# Patient Record
Sex: Male | Born: 1970 | Race: Black or African American | Hispanic: No | Marital: Single | Smoking: Current every day smoker
Health system: Southern US, Community
[De-identification: ages and names within clinical notes are randomized; demographics above are authoritative.]

## PROBLEM LIST (undated history)

## (undated) DIAGNOSIS — I10 Essential (primary) hypertension: Secondary | ICD-10-CM

## (undated) HISTORY — PX: CHOLECYSTECTOMY: SHX55

---

## 2003-07-08 ENCOUNTER — Encounter: Payer: Self-pay | Admitting: Emergency Medicine

## 2003-07-08 ENCOUNTER — Emergency Department (HOSPITAL_COMMUNITY): Admission: EM | Admit: 2003-07-08 | Discharge: 2003-07-08 | Payer: Self-pay | Admitting: Emergency Medicine

## 2004-01-12 ENCOUNTER — Emergency Department (HOSPITAL_COMMUNITY): Admission: EM | Admit: 2004-01-12 | Discharge: 2004-01-12 | Payer: Self-pay | Admitting: Emergency Medicine

## 2004-03-29 ENCOUNTER — Emergency Department (HOSPITAL_COMMUNITY): Admission: EM | Admit: 2004-03-29 | Discharge: 2004-03-29 | Payer: Self-pay | Admitting: Emergency Medicine

## 2004-10-31 ENCOUNTER — Emergency Department (HOSPITAL_COMMUNITY): Admission: EM | Admit: 2004-10-31 | Discharge: 2004-10-31 | Payer: Self-pay | Admitting: Emergency Medicine

## 2015-03-19 ENCOUNTER — Emergency Department (HOSPITAL_COMMUNITY)
Admission: EM | Admit: 2015-03-19 | Discharge: 2015-03-19 | Disposition: A | Discharge status: Home / Self Care | Payer: No Typology Code available for payment source | Attending: Emergency Medicine | Admitting: Emergency Medicine

## 2015-03-19 ENCOUNTER — Encounter (HOSPITAL_COMMUNITY): Payer: Self-pay | Admitting: *Deleted

## 2015-03-19 ENCOUNTER — Emergency Department (HOSPITAL_COMMUNITY): Payer: No Typology Code available for payment source

## 2015-03-19 DIAGNOSIS — Y999 Unspecified external cause status: Secondary | ICD-10-CM | POA: Insufficient documentation

## 2015-03-19 DIAGNOSIS — S4991XA Unspecified injury of right shoulder and upper arm, initial encounter: Secondary | ICD-10-CM | POA: Diagnosis not present

## 2015-03-19 DIAGNOSIS — Y939 Activity, unspecified: Secondary | ICD-10-CM | POA: Diagnosis not present

## 2015-03-19 DIAGNOSIS — Y92410 Unspecified street and highway as the place of occurrence of the external cause: Secondary | ICD-10-CM | POA: Diagnosis not present

## 2015-03-19 DIAGNOSIS — Z720 Tobacco use: Secondary | ICD-10-CM | POA: Insufficient documentation

## 2015-03-19 DIAGNOSIS — S79911A Unspecified injury of right hip, initial encounter: Secondary | ICD-10-CM | POA: Insufficient documentation

## 2015-03-19 DIAGNOSIS — T148XXA Other injury of unspecified body region, initial encounter: Secondary | ICD-10-CM

## 2015-03-19 NOTE — Discharge Instructions (Signed)
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
## 2016-01-21 ENCOUNTER — Encounter (HOSPITAL_COMMUNITY): Admission: EM | Payer: Self-pay | Source: Home / Self Care

## 2016-01-21 ENCOUNTER — Inpatient Hospital Stay (HOSPITAL_COMMUNITY): Payer: Medicaid Other | Admitting: Certified Registered Nurse Anesthetist

## 2016-01-21 ENCOUNTER — Inpatient Hospital Stay (HOSPITAL_COMMUNITY)
Admission: EM | Admit: 2016-01-21 | Discharge: 2016-01-23 | DRG: 340 | Payer: Medicaid Other | Attending: General Surgery | Admitting: General Surgery

## 2016-01-21 ENCOUNTER — Emergency Department (HOSPITAL_COMMUNITY): Payer: Medicaid Other

## 2016-01-21 ENCOUNTER — Encounter (HOSPITAL_COMMUNITY): Payer: Self-pay | Admitting: *Deleted

## 2016-01-21 DIAGNOSIS — R300 Dysuria: Secondary | ICD-10-CM | POA: Diagnosis present

## 2016-01-21 DIAGNOSIS — Z653 Problems related to other legal circumstances: Secondary | ICD-10-CM | POA: Diagnosis not present

## 2016-01-21 DIAGNOSIS — I10 Essential (primary) hypertension: Secondary | ICD-10-CM | POA: Diagnosis present

## 2016-01-21 DIAGNOSIS — F17210 Nicotine dependence, cigarettes, uncomplicated: Secondary | ICD-10-CM | POA: Diagnosis present

## 2016-01-21 DIAGNOSIS — K353 Acute appendicitis with localized peritonitis, without perforation or gangrene: Secondary | ICD-10-CM

## 2016-01-21 DIAGNOSIS — K37 Unspecified appendicitis: Secondary | ICD-10-CM | POA: Diagnosis present

## 2016-01-21 HISTORY — DX: Essential (primary) hypertension: I10

## 2016-01-21 HISTORY — PX: LAPAROSCOPIC APPENDECTOMY: SHX408

## 2016-01-21 LAB — COMPREHENSIVE METABOLIC PANEL
ALBUMIN: 3.7 g/dL (ref 3.5–5.0)
ALK PHOS: 49 U/L (ref 38–126)
ALT: 20 U/L (ref 17–63)
AST: 18 U/L (ref 15–41)
Anion gap: 14 (ref 5–15)
BILIRUBIN TOTAL: 0.9 mg/dL (ref 0.3–1.2)
BUN: 13 mg/dL (ref 6–20)
CHLORIDE: 102 mmol/L (ref 101–111)
CO2: 23 mmol/L (ref 22–32)
CREATININE: 1.09 mg/dL (ref 0.61–1.24)
Calcium: 9.2 mg/dL (ref 8.9–10.3)
GFR calc non Af Amer: >60 mL/min (ref >60)
Glucose, Bld: 101 mg/dL — ABNORMAL HIGH (ref 65–99)
Potassium: 4.4 mmol/L (ref 3.5–5.1)
Sodium: 139 mmol/L (ref 135–145)
Total Protein: 7.1 g/dL (ref 6.5–8.1)

## 2016-01-21 LAB — CBC
HCT: 42.5 % (ref 39.0–52.0)
Hemoglobin: 15.4 g/dL (ref 13.0–17.0)
MCH: 30.7 pg (ref 26.0–34.0)
MCHC: 36.2 g/dL — AB (ref 30.0–36.0)
MCV: 84.7 fL (ref 78.0–100.0)
PLATELETS: 210 10*3/uL (ref 150–400)
RBC: 5.02 MIL/uL (ref 4.22–5.81)
RDW: 12.2 % (ref 11.5–15.5)
WBC: 14.1 10*3/uL — ABNORMAL HIGH (ref 4.0–10.5)

## 2016-01-21 LAB — LIPASE, BLOOD: Lipase: 19 U/L (ref 11–51)

## 2016-01-21 SURGERY — APPENDECTOMY, LAPAROSCOPIC
Anesthesia: General

## 2016-01-21 MED ORDER — METRONIDAZOLE IN NACL 5-0.79 MG/ML-% IV SOLN
500.0000 mg | Freq: Three times a day (TID) | INTRAVENOUS | Status: DC
  Administered 2016-01-21 – 2016-01-22 (×3): 500 mg via INTRAVENOUS
  Filled 2016-01-21 (×4): qty 100

## 2016-01-21 MED ORDER — ACETAMINOPHEN 650 MG RE SUPP: 650.0000 mg | Freq: Four times a day (QID) | RECTAL | Status: DC | PRN

## 2016-01-21 MED ORDER — PANTOPRAZOLE SODIUM 40 MG IV SOLR
40.0000 mg | Freq: Every day | INTRAVENOUS | Status: DC
  Administered 2016-01-21: 40 mg via INTRAVENOUS
  Filled 2016-01-21: qty 40

## 2016-01-21 MED ORDER — IOHEXOL 300 MG/ML  SOLN
25.0000 mL | Freq: Once | INTRAMUSCULAR | Status: AC | PRN
  Administered 2016-01-21: 25 mL via ORAL

## 2016-01-21 MED ORDER — FENTANYL CITRATE (PF) 100 MCG/2ML IJ SOLN
INTRAMUSCULAR | Status: DC | PRN
  Administered 2016-01-21: 100 ug via INTRAVENOUS

## 2016-01-21 MED ORDER — ONDANSETRON HCL 4 MG/2ML IJ SOLN
4.0000 mg | Freq: Four times a day (QID) | INTRAMUSCULAR | Status: DC | PRN
  Administered 2016-01-21: 4 mg via INTRAVENOUS
  Filled 2016-01-21: qty 2

## 2016-01-21 MED ORDER — ROCURONIUM BROMIDE 100 MG/10ML IV SOLN
INTRAVENOUS | Status: DC | PRN
  Administered 2016-01-21: 25 mg via INTRAVENOUS

## 2016-01-21 MED ORDER — DEXAMETHASONE SODIUM PHOSPHATE 10 MG/ML IJ SOLN
INTRAMUSCULAR | Status: DC | PRN
  Administered 2016-01-21: 10 mg via INTRAVENOUS

## 2016-01-21 MED ORDER — PROPOFOL 10 MG/ML IV BOLUS
INTRAVENOUS | Status: AC
  Filled 2016-01-21: qty 20

## 2016-01-21 MED ORDER — ENOXAPARIN SODIUM 40 MG/0.4ML ~~LOC~~ SOLN
40.0000 mg | SUBCUTANEOUS | Status: DC
  Administered 2016-01-21 – 2016-01-22 (×2): 40 mg via SUBCUTANEOUS
  Filled 2016-01-21 (×2): qty 0.4

## 2016-01-21 MED ORDER — BUPIVACAINE HCL (PF) 0.25 % IJ SOLN
INTRAMUSCULAR | Status: AC
  Filled 2016-01-21: qty 30

## 2016-01-21 MED ORDER — LIDOCAINE HCL (CARDIAC) 20 MG/ML IV SOLN
INTRAVENOUS | Status: AC
  Filled 2016-01-21: qty 5

## 2016-01-21 MED ORDER — BUPIVACAINE-EPINEPHRINE 0.25% -1:200000 IJ SOLN
INTRAMUSCULAR | Status: DC | PRN
  Administered 2016-01-21: 8 mL

## 2016-01-21 MED ORDER — EPHEDRINE SULFATE 50 MG/ML IJ SOLN
INTRAMUSCULAR | Status: AC
  Filled 2016-01-21: qty 1

## 2016-01-21 MED ORDER — HYDROMORPHONE HCL 1 MG/ML IJ SOLN: 0.2500 mg | INTRAMUSCULAR | Status: DC | PRN

## 2016-01-21 MED ORDER — SODIUM CHLORIDE 0.9 % IV BOLUS (SEPSIS)
1000.0000 mL | Freq: Once | INTRAVENOUS | Status: AC
  Administered 2016-01-21: 1000 mL via INTRAVENOUS

## 2016-01-21 MED ORDER — PROPOFOL 10 MG/ML IV BOLUS
INTRAVENOUS | Status: DC | PRN
  Administered 2016-01-21: 200 mg via INTRAVENOUS

## 2016-01-21 MED ORDER — CIPROFLOXACIN IN D5W 400 MG/200ML IV SOLN
400.0000 mg | Freq: Two times a day (BID) | INTRAVENOUS | Status: DC
  Administered 2016-01-21 – 2016-01-22 (×2): 400 mg via INTRAVENOUS
  Filled 2016-01-21 (×3): qty 200

## 2016-01-21 MED ORDER — MORPHINE SULFATE (PF) 2 MG/ML IV SOLN
2.0000 mg | INTRAVENOUS | Status: DC | PRN
  Administered 2016-01-21: 2 mg via INTRAVENOUS
  Filled 2016-01-21: qty 1

## 2016-01-21 MED ORDER — DEXAMETHASONE SODIUM PHOSPHATE 10 MG/ML IJ SOLN
INTRAMUSCULAR | Status: AC
  Filled 2016-01-21: qty 1

## 2016-01-21 MED ORDER — SUCCINYLCHOLINE CHLORIDE 20 MG/ML IJ SOLN
INTRAMUSCULAR | Status: DC | PRN
  Administered 2016-01-21: 100 mg via INTRAVENOUS

## 2016-01-21 MED ORDER — FENTANYL CITRATE (PF) 250 MCG/5ML IJ SOLN
INTRAMUSCULAR | Status: AC
  Filled 2016-01-21: qty 5

## 2016-01-21 MED ORDER — OXYCODONE HCL 5 MG PO TABS
5.0000 mg | ORAL_TABLET | ORAL | Status: DC | PRN
  Administered 2016-01-22: 10 mg via ORAL
  Administered 2016-01-22 (×2): 5 mg via ORAL
  Administered 2016-01-23: 10 mg via ORAL
  Filled 2016-01-21: qty 1
  Filled 2016-01-21: qty 2
  Filled 2016-01-21: qty 1
  Filled 2016-01-21: qty 2

## 2016-01-21 MED ORDER — LIDOCAINE HCL (CARDIAC) 20 MG/ML IV SOLN
INTRAVENOUS | Status: DC | PRN
  Administered 2016-01-21: 50 mg via INTRAVENOUS

## 2016-01-21 MED ORDER — SODIUM CHLORIDE 0.9 % IV SOLN
INTRAVENOUS | Status: DC
  Administered 2016-01-21 – 2016-01-22 (×2): via INTRAVENOUS

## 2016-01-21 MED ORDER — IOHEXOL 300 MG/ML  SOLN
100.0000 mL | Freq: Once | INTRAMUSCULAR | Status: AC | PRN
  Administered 2016-01-21: 100 mL via INTRAVENOUS

## 2016-01-21 MED ORDER — ONDANSETRON HCL 4 MG/2ML IJ SOLN
INTRAMUSCULAR | Status: AC
  Filled 2016-01-21: qty 2

## 2016-01-21 MED ORDER — ONDANSETRON 4 MG PO TBDP: 4.0000 mg | ORAL_TABLET | Freq: Four times a day (QID) | ORAL | Status: DC | PRN

## 2016-01-21 MED ORDER — LACTATED RINGERS IV SOLN
INTRAVENOUS | Status: DC | PRN
  Administered 2016-01-21: 21:00:00 via INTRAVENOUS

## 2016-01-21 MED ORDER — SUGAMMADEX SODIUM 200 MG/2ML IV SOLN
INTRAVENOUS | Status: AC
  Filled 2016-01-21: qty 2

## 2016-01-21 MED ORDER — KETOROLAC TROMETHAMINE 15 MG/ML IJ SOLN
15.0000 mg | Freq: Four times a day (QID) | INTRAMUSCULAR | Status: DC | PRN
  Administered 2016-01-22: 15 mg via INTRAVENOUS
  Filled 2016-01-21: qty 1

## 2016-01-21 MED ORDER — ACETAMINOPHEN 325 MG PO TABS: 650.0000 mg | ORAL_TABLET | Freq: Four times a day (QID) | ORAL | Status: DC | PRN

## 2016-01-21 MED ORDER — SIMETHICONE 80 MG PO CHEW
40.0000 mg | CHEWABLE_TABLET | Freq: Four times a day (QID) | ORAL | Status: DC | PRN
  Administered 2016-01-22: 40 mg via ORAL
  Filled 2016-01-21: qty 1

## 2016-01-21 MED ORDER — ONDANSETRON HCL 4 MG/2ML IJ SOLN
INTRAMUSCULAR | Status: DC | PRN
  Administered 2016-01-21: 4 mg via INTRAVENOUS

## 2016-01-21 MED ORDER — MIDAZOLAM HCL 5 MG/5ML IJ SOLN
INTRAMUSCULAR | Status: DC | PRN
  Administered 2016-01-21: 2 mg via INTRAVENOUS

## 2016-01-21 MED ORDER — SODIUM CHLORIDE 0.9 % IR SOLN
Status: DC | PRN
  Administered 2016-01-21: 1000 mL

## 2016-01-21 MED ORDER — METHOCARBAMOL 500 MG PO TABS
500.0000 mg | ORAL_TABLET | Freq: Four times a day (QID) | ORAL | Status: DC | PRN
  Administered 2016-01-22 (×3): 500 mg via ORAL
  Filled 2016-01-21 (×3): qty 1

## 2016-01-21 MED ORDER — PHENYLEPHRINE 40 MCG/ML (10ML) SYRINGE FOR IV PUSH (FOR BLOOD PRESSURE SUPPORT)
PREFILLED_SYRINGE | INTRAVENOUS | Status: AC
  Filled 2016-01-21: qty 10

## 2016-01-21 MED ORDER — SUGAMMADEX SODIUM 200 MG/2ML IV SOLN
INTRAVENOUS | Status: DC | PRN
  Administered 2016-01-21: 200 mg via INTRAVENOUS

## 2016-01-21 MED ORDER — SUCCINYLCHOLINE CHLORIDE 20 MG/ML IJ SOLN
INTRAMUSCULAR | Status: AC
  Filled 2016-01-21: qty 1

## 2016-01-21 MED ORDER — ROCURONIUM BROMIDE 50 MG/5ML IV SOLN
INTRAVENOUS | Status: AC
  Filled 2016-01-21: qty 1

## 2016-01-21 MED ORDER — MIDAZOLAM HCL 2 MG/2ML IJ SOLN
INTRAMUSCULAR | Status: AC
Start: 2016-01-21 — End: 2016-01-21
  Filled 2016-01-21: qty 2

## 2016-01-21 MED ORDER — ENOXAPARIN SODIUM 40 MG/0.4ML ~~LOC~~ SOLN: 40.0000 mg | SUBCUTANEOUS | Status: DC

## 2016-01-21 SURGICAL SUPPLY — 50 items
ADH SKN CLS LQ APL DERMABOND (GAUZE/BANDAGES/DRESSINGS) ×1
APPLIER CLIP 5 13 M/L LIGAMAX5 (MISCELLANEOUS) ×3
APPLIER CLIP ROT 10 11.4 M/L (STAPLE)
APR CLP MED LRG 11.4X10 (STAPLE)
APR CLP MED LRG 5 ANG JAW (MISCELLANEOUS) ×1
BAG SPEC RTRVL 10 TROC 200 (ENDOMECHANICALS) ×1
CANISTER SUCTION 2500CC (MISCELLANEOUS) ×3 IMPLANT
CHLORAPREP W/TINT 26ML (MISCELLANEOUS) ×3 IMPLANT
CLIP APPLIE 5 13 M/L LIGAMAX5 (MISCELLANEOUS) IMPLANT
CLIP APPLIE ROT 10 11.4 M/L (STAPLE) IMPLANT
CLOSURE STERI-STRIP 1/4X4 (GAUZE/BANDAGES/DRESSINGS) ×2 IMPLANT
CLOSURE WOUND 1/2 X4 (GAUZE/BANDAGES/DRESSINGS) ×1
COVER SURGICAL LIGHT HANDLE (MISCELLANEOUS) ×3 IMPLANT
CUTTER FLEX LINEAR 45M (STAPLE) ×3 IMPLANT
DERMABOND ADHESIVE PROPEN (GAUZE/BANDAGES/DRESSINGS) ×2
DERMABOND ADVANCED .7 DNX6 (GAUZE/BANDAGES/DRESSINGS) IMPLANT
DEVICE TROCAR PUNCTURE CLOSURE (ENDOMECHANICALS) ×3 IMPLANT
ELECT REM PT RETURN 9FT ADLT (ELECTROSURGICAL) ×3
ELECTRODE REM PT RTRN 9FT ADLT (ELECTROSURGICAL) ×1 IMPLANT
GLOVE BIO SURGEON STRL SZ7 (GLOVE) ×3 IMPLANT
GLOVE BIOGEL PI IND STRL 7.5 (GLOVE) ×1 IMPLANT
GLOVE BIOGEL PI INDICATOR 7.5 (GLOVE) ×2
GOWN STRL REUS W/ TWL LRG LVL3 (GOWN DISPOSABLE) ×3 IMPLANT
GOWN STRL REUS W/TWL LRG LVL3 (GOWN DISPOSABLE) ×9
KIT BASIN OR (CUSTOM PROCEDURE TRAY) ×3 IMPLANT
KIT ROOM TURNOVER OR (KITS) ×3 IMPLANT
LIQUID BAND (GAUZE/BANDAGES/DRESSINGS) ×3 IMPLANT
NS IRRIG 1000ML POUR BTL (IV SOLUTION) ×3 IMPLANT
PAD ARMBOARD 7.5X6 YLW CONV (MISCELLANEOUS) ×6 IMPLANT
POUCH RETRIEVAL ECOSAC 10 (ENDOMECHANICALS) ×1 IMPLANT
POUCH RETRIEVAL ECOSAC 10MM (ENDOMECHANICALS) ×2
RELOAD 45 VASCULAR/THIN (ENDOMECHANICALS) ×3 IMPLANT
RELOAD STAPLE 45 2.5 WHT GRN (ENDOMECHANICALS) ×1 IMPLANT
RELOAD STAPLE 45 3.5 BLU ETS (ENDOMECHANICALS) IMPLANT
RELOAD STAPLE TA45 3.5 REG BLU (ENDOMECHANICALS) IMPLANT
SCALPEL HARMONIC ACE (MISCELLANEOUS) ×3 IMPLANT
SCISSORS LAP 5X35 DISP (ENDOMECHANICALS) IMPLANT
SET IRRIG TUBING LAPAROSCOPIC (IRRIGATION / IRRIGATOR) ×3 IMPLANT
SLEEVE ENDOPATH XCEL 5M (ENDOMECHANICALS) ×3 IMPLANT
SPECIMEN JAR SMALL (MISCELLANEOUS) ×3 IMPLANT
STRIP CLOSURE SKIN 1/2X4 (GAUZE/BANDAGES/DRESSINGS) ×2 IMPLANT
SUT MNCRL AB 4-0 PS2 18 (SUTURE) ×3 IMPLANT
SUT VICRYL 0 UR6 27IN ABS (SUTURE) ×3 IMPLANT
TOWEL OR 17X24 6PK STRL BLUE (TOWEL DISPOSABLE) ×3 IMPLANT
TOWEL OR 17X26 10 PK STRL BLUE (TOWEL DISPOSABLE) ×3 IMPLANT
TRAY FOLEY CATH 16FR SILVER (SET/KITS/TRAYS/PACK) ×3 IMPLANT
TRAY LAPAROSCOPIC MC (CUSTOM PROCEDURE TRAY) ×3 IMPLANT
TROCAR XCEL BLUNT TIP 100MML (ENDOMECHANICALS) ×3 IMPLANT
TROCAR XCEL NON-BLD 5MMX100MML (ENDOMECHANICALS) ×3 IMPLANT
TUBING INSUFFLATION (TUBING) ×3 IMPLANT

## 2016-01-21 NOTE — ED Notes (Signed)
Patient handed urinal to use to attempt to provide an urinal specimen; patient still in custody of [facility]
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
## 2016-01-21 NOTE — H&P (Signed)
Glenn Hanson is an 45 y.o. male.   Chief Complaint: abdominal pain HPI: 9 yom who is incarcerated presents with several days of abdominal pain primarily suprapubic now.  Has some n/v, no fevers.  Some dysuria.  Less oral intake, had bm today.     Past Medical History  Diagnosis Date  . Hypertension     Past Surgical History  Procedure Laterality Date  . Cholecystectomy      No family history on file. Social History:  reports that he has been smoking Cigarettes.  He has been smoking about 2.00 packs per day. He does not have any smokeless tobacco history on file. He reports that he drinks about 1.2 oz of alcohol per week. He reports that he does not use illicit drugs.  Allergies:  Allergies  Allergen Reactions  . Flexeril [Cyclobenzaprine]     itching  . Keflex [Cephalexin]     Hives     meds none  Results for orders placed or performed during the hospital encounter of 01/21/16 (from the past 48 hour(s))  Lipase, blood     Status: None   Collection Time: 01/21/16  3:46 PM  Result Value Ref Range   Lipase 19 11 - 51 U/L  Comprehensive metabolic panel     Status: Abnormal   Collection Time: 01/21/16  3:46 PM  Result Value Ref Range   Sodium 139 135 - 145 mmol/L   Potassium 4.4 3.5 - 5.1 mmol/L   Chloride 102 101 - 111 mmol/L   CO2 23 22 - 32 mmol/L   Glucose, Bld 101 (H) 65 - 99 mg/dL   BUN 13 6 - 20 mg/dL   Creatinine, Ser 1.09 0.61 - 1.24 mg/dL   Calcium 9.2 8.9 - 10.3 mg/dL   Total Protein 7.1 6.5 - 8.1 g/dL   Albumin 3.7 3.5 - 5.0 g/dL   AST 18 15 - 41 U/L   ALT 20 17 - 63 U/L   Alkaline Phosphatase 49 38 - 126 U/L   Total Bilirubin 0.9 0.3 - 1.2 mg/dL   GFR calc non Af Amer >60 >60 mL/min   GFR calc Af Amer >60 >60 mL/min    Comment: (NOTE) The eGFR has been calculated using the CKD EPI equation. This calculation has not been validated in all clinical situations. eGFR's persistently <60 mL/min signify possible Chronic Kidney Disease.    Anion gap 14  5 - 15  CBC     Status: Abnormal   Collection Time: 01/21/16  3:46 PM  Result Value Ref Range   WBC 14.1 (H) 4.0 - 10.5 K/uL   RBC 5.02 4.22 - 5.81 MIL/uL   Hemoglobin 15.4 13.0 - 17.0 g/dL   HCT 42.5 39.0 - 52.0 %   MCV 84.7 78.0 - 100.0 fL   MCH 30.7 26.0 - 34.0 pg   MCHC 36.2 (H) 30.0 - 36.0 g/dL   RDW 12.2 11.5 - 15.5 %   Platelets 210 150 - 400 K/uL   Ct Abdomen Pelvis W Contrast  01/21/2016  CLINICAL DATA:  Abdominal pain and distention with nausea and vomiting for several days. Cholecystectomy. EXAM: CT ABDOMEN AND PELVIS WITH CONTRAST TECHNIQUE: Multidetector CT imaging of the abdomen and pelvis was performed using the standard protocol following bolus administration of intravenous contrast. CONTRAST:  182m OMNIPAQUE IOHEXOL 300 MG/ML  SOLN COMPARISON:  None. FINDINGS: Lower chest: Anterior right lower lobe 4 mm solid pulmonary nodule (series 205/image 3). Hepatobiliary: Hypodense 0.4 cm lesion in segment 8 at  the right liver dome, too small to characterize. Otherwise normal liver. Cholecystectomy. No biliary ductal dilatation. Pancreas: Normal, with no mass or duct dilation. Spleen: Normal size. No mass. Adrenals/Urinary Tract: Normal adrenals. Normal kidneys with no hydronephrosis and no renal mass. Normal bladder. Stomach/Bowel: Grossly normal stomach. Normal caliber small bowel. Oral contrast progresses to the rectum. There is continuous circumferential bowel wall thickening in the distal and terminal ileum. There is diffuse dilatation of the appendix (13 mm appendiceal diameter). There is diffuse wall thickening and wall hyperenhancement throughout the appendix, with associated periappendiceal fat stranding and ill-defined fluid in the right lower quadrant, in keeping with acute appendicitis. Normal large bowel with no diverticulosis, large bowel wall thickening or pericolonic fat stranding. Vascular/Lymphatic: Normal caliber abdominal aorta. Patent portal, splenic, hepatic and renal  veins. No pathologically enlarged lymph nodes in the abdomen or pelvis. Reproductive: Top-normal size prostate. Other: No pneumoperitoneum, ascites or focal fluid collection. Musculoskeletal: No aggressive appearing focal osseous lesions. Mild degenerative changes in the visualized thoracolumbar spine. IMPRESSION: 1. Acute appendicitis.  No perforation or abscess. 2. Wall thickening in the distal and terminal ileum, probably reactive. 3. Right lower lobe 4 mm pulmonary nodule. If the patient is at high risk for bronchogenic carcinoma, follow-up chest CT at 1 year is recommended. If the patient is at low risk, no follow-up is needed. This recommendation follows the consensus statement: Guidelines for Management of Small Pulmonary Nodules Detected on CT Scans: A Statement from the [place] as published in Radiology 2005; 237:395-400. These results were called by telephone at the time of interpretation on 01/21/2016 at 5:50 pm to [name] , who verbally acknowledged these results. Electronically Signed   By: Ilona Sorrel M.D.   On: 01/21/2016 17:52    Review of Systems  Constitutional: Negative for fever and chills.  Respiratory: Negative for shortness of breath.   Cardiovascular: Negative for chest pain.  Gastrointestinal: Positive for nausea, vomiting and abdominal pain. Negative for diarrhea, constipation and blood in stool.  Genitourinary: Positive for dysuria.    Blood pressure 124/76, pulse 76, temperature 98.5 F (36.9 C), temperature source Oral, resp. rate 16, height '5\' 6"'$  (1.676 m), weight 69.854 kg (154 lb), SpO2 100 %. Physical Exam  Vitals reviewed. Constitutional: He is oriented to person, place, and time. He appears well-developed and well-nourished.  HENT:  Head: Normocephalic and atraumatic.  Eyes: No scleral icterus.  Cardiovascular: Normal rate, regular rhythm and normal heart sounds.   Respiratory: Effort normal and breath sounds normal. He has no wheezes. He has no  rales.  GI: Soft. Bowel sounds are normal. He exhibits no distension. There is tenderness in the right lower quadrant, suprapubic area and left lower quadrant. No hernia.  Lymphadenopathy:    He has no cervical adenopathy.  Neurological: He is alert and oriented to person, place, and time.     Assessment/Plan Appendicitis  Discussed lap appy, abx.  Discussed possible perforation vs acute appy and recovery/time in hospital.  Risks discussed.  Will proceed tonight  Bacilio Abascal 01/21/2016, 7:20 PM

## 2016-01-21 NOTE — Anesthesia Procedure Notes (Signed)
Procedure Name: Intubation Date/Time: 01/21/2016 8:37 PM Performed by: Little Ishikawa L Pre-anesthesia Checklist: Patient identified, Timeout performed, Emergency Drugs available, Suction available and Patient being monitored Patient Re-evaluated:Patient Re-evaluated prior to inductionOxygen Delivery Method: Circle system utilized Preoxygenation: Pre-oxygenation with 100% oxygen Intubation Type: IV induction, Rapid sequence and Cricoid Pressure applied Laryngoscope Size: Mac and 4 (no attempt to mask ventilate. RSI) Grade View: Grade I Tube type: Oral Tube size: 7.5 mm Number of attempts: 1 Airway Equipment and Method: Stylet Placement Confirmation: ETT inserted through vocal cords under direct vision,  positive ETCO2 and breath sounds checked- equal and bilateral Secured at: 22 cm Tube secured with: Tape Dental Injury: Teeth and Oropharynx as per pre-operative assessment

## 2016-01-21 NOTE — Anesthesia Preprocedure Evaluation (Addendum)
Anesthesia Evaluation  Patient identified by MRN, date of birth, ID band Patient awake    Reviewed: Allergy & Precautions, H&P , NPO status , Patient's Chart, lab work & pertinent test results  Airway Mallampati: II  TM Distance: >3 FB Neck ROM: Full    Dental no notable dental hx. (+) Teeth Intact, Dental Advisory Given   Pulmonary    Pulmonary exam normal breath sounds clear to auscultation       Cardiovascular hypertension,  Rhythm:Regular Rate:Normal     Neuro/Psych negative neurological ROS  negative psych ROS   GI/Hepatic negative GI ROS, Neg liver ROS,   Endo/Other  negative endocrine ROS  Renal/GU negative Renal ROS  negative genitourinary   Musculoskeletal   Abdominal   Peds  Hematology negative hematology ROS (+)   Anesthesia Other Findings   Reproductive/Obstetrics negative OB ROS                           Anesthesia Physical Anesthesia Plan  ASA: II and emergent  Anesthesia Plan: General   Post-op Pain Management:    Induction: Intravenous, Rapid sequence and Cricoid pressure planned  Airway Management Planned: Oral ETT  Additional Equipment:   Intra-op Plan:   Post-operative Plan: Extubation in OR  Informed Consent: I have reviewed the patients History and Physical, chart, labs and discussed the procedure including the risks, benefits and alternatives for the proposed anesthesia with the patient or authorized representative who has indicated his/her understanding and acceptance.   Dental advisory given  Plan Discussed with: CRNA  Anesthesia Plan Comments:         Anesthesia Quick Evaluation

## 2016-01-21 NOTE — OR Nursing (Signed)
Pt brought into OR with two Pharmacist, hospital per Forensic Patient policy.

## 2016-01-21 NOTE — ED Provider Notes (Signed)
Pt seen and examined.  D/W PA-C.  Reports AP for 5-6 days, no w localized to RLQ-Suprapubic abdomen.  + Leukocytosis, and CT with +Appendicitis, -Perforation.  Discussed with patient and Dr. Dwain Sarna of [facility].  Rolland Porter, MD 01/21/16 Rickey Primus

## 2016-01-21 NOTE — Anesthesia Postprocedure Evaluation (Signed)
Anesthesia Post Note  Patient: Glenn Hanson  Procedure(s) Performed: Procedure(s) (LRB): APPENDECTOMY LAPAROSCOPIC (N/A)  Patient location during evaluation: PACU Anesthesia Type: General Level of consciousness: awake and alert Pain management: pain level controlled Vital Signs Assessment: post-procedure vital signs reviewed and stable Respiratory status: spontaneous breathing, nonlabored ventilation, respiratory function stable and patient connected to nasal cannula oxygen Cardiovascular status: blood pressure returned to baseline and stable Postop Assessment: no signs of nausea or vomiting Anesthetic complications: no    Last Vitals:  Filed Vitals:   01/21/16 2142 01/21/16 2156  BP: 126/55 125/83  Pulse:  69  Temp: 36.4 C 36.4 C  Resp: 17 16    Last Pain:  Filed Vitals:   01/21/16 2159  PainSc: 3                  Lavere Stork,W. EDMOND

## 2016-01-21 NOTE — Transfer of Care (Signed)
Immediate Anesthesia Transfer of Care Note  Patient: Glenn Hanson  Procedure(s) Performed: Procedure(s): APPENDECTOMY LAPAROSCOPIC (N/A)  Patient Location: PACU  Anesthesia Type:General  Level of Consciousness: awake  Airway & Oxygen Therapy: Patient Spontanous Breathing and Patient connected to nasal cannula oxygen  Post-op Assessment: Report given to RN and Post -op Vital signs reviewed and stable  Post vital signs: stable  Last Vitals:  Filed Vitals:   01/21/16 1915 01/21/16 1930  BP: 124/76 115/82  Pulse: 76 73  Temp:    Resp:      Complications: No apparent anesthesia complications

## 2016-01-21 NOTE — Op Note (Signed)
Preoperative diagnosis: acute suppurative appendicitis Postoperative diagnosis: same as above Procedure: laparoscopic appendectomy Surgeon: Dr Harden Mo Anesthesia: general EBL: minimal Drains none Specimen appendix to pathology Complications: none Sponge count correct at completion Disposition to recovery stable  Indications: This is a 83 yof with rlq pain and ct with appendicitis. We discussed laparoscopic appendectomy.   Procedure: After informed consent was obtained the patient was taken to the operating room. He was already given antibiotics. Sequential compression devices were on his legs.He was placed under general anesthesia without complication. His abdomen was prepped and draped in the standard sterile surgical fashion. A surgical timeout was then performed. A foley catheter was placed.   I infiltrated marcaine below the umbilicus. I made an incision and then entered the fascia sharply. I then entered the peritoneum bluntly. I placed a 0 vicryl pursestring suture and inserted a hasson trocar.I then inserted 2 further 5 mm trocars in the suprapubic region and the left mid abdomen. He had acute suppurative appendicitis that was not perforated but appendix had exudate and there was some murky fluid next to it. . I saw the TI into the right colon. I then dissected it free from the cecum. I divided the mesoappendix with the harmonic scalpel. The appendiceal artery was divided and bled as the harmonic did not control initially. I reapplied harmonic and placed a clip on this.  I then divided the appendix with the gia stapler.  I then placed this in a bag and removed it from the abdomen.  I then obtained hemostasis and irrigated. I then removed the umbilical trocar and closed with 0 vicryl and the endoclose device after tying down the pursestring.  I then desufflated the abdomen and removed all my remaining trocars. I then closed these with 4-0 Monocryl and Dermabond. He tolerated  this well was extubated and transferred to the recovery room in stable condition
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
## 2016-01-21 NOTE — ED Notes (Signed)
MD Wakefield at bedside 

## 2016-01-21 NOTE — ED Notes (Signed)
Patient is accompanied by a [facility] and a [employer]

## 2016-01-21 NOTE — ED Provider Notes (Signed)
CSN: [id number]     Arrival date & time 01/21/16  1454 History   First MD Initiated Contact with Patient 01/21/16 1518     Chief Complaint  Patient presents with  . Abdominal Pain   (Consider location/radiation/quality/duration/timing/severity/associated sxs/prior Treatment) HPI 45 y.o. male with a hx of cholecystectomy, presents from [place] to the Emergency Department today complaining of lower abdominal pain since Wednesday with associated N/V. Pt notes that he threw up specs of blood on Wednesday as well, but has since resolved. No hx ulcers. Describes the pain as sharp 7/10 intermittent on the RLQ and LLQ. Notes dysuria as well. No hematuria. Has decrease in PO intake due to Nausea. No fevers. No other symptoms noted.   Past Medical History  Diagnosis Date  . Hypertension    Past Surgical History  Procedure Laterality Date  . Cholecystectomy     No family history on file. Social History  Substance Use Topics  . Smoking status: Current Every Day Smoker -- 2.00 packs/day    Types: Cigarettes  . Smokeless tobacco: None  . Alcohol Use: 1.2 oz/week    2 Cans of beer per week    Review of Systems ROS reviewed and all are negative for acute change except as noted in the HPI.  Allergies  Flexeril and Keflex  Home Medications   Prior to Admission medications   Not on File   BP 138/87 mmHg  Pulse 77  Temp(Src) 98.5 F (36.9 C) (Oral)  Resp 16  Ht  (1.676 m)  Wt 69.854 kg  BMI 24.87 kg/m2  SpO2 98%   Physical Exam  Constitutional: He is oriented to person, place, and time. He appears well-developed and well-nourished.  HENT:  Head: Normocephalic and atraumatic.  Eyes: EOM are normal.  Neck: Normal range of motion. Neck supple.  Cardiovascular: Normal rate, regular rhythm and normal heart sounds.   Pulmonary/Chest: Effort normal and breath sounds normal.  Abdominal: Soft. Normal appearance and bowel sounds are normal. He exhibits no ascites and no mass. There is  tenderness in the right lower quadrant and left lower quadrant. There is no rebound, no CVA tenderness, no tenderness at McBurney's point and negative Murphy's sign.  Musculoskeletal: Normal range of motion.  Neurological: He is alert and oriented to person, place, and time.  Skin: Skin is warm and dry.  Psychiatric: He has a normal mood and affect. His behavior is normal. Thought content normal.  Nursing note and vitals reviewed.  ED Course  Procedures (including critical care time) Labs Review Labs Reviewed  COMPREHENSIVE METABOLIC PANEL - Abnormal; Notable for the following:    Glucose, Bld 101 (*)    All other components within normal limits  CBC - Abnormal; Notable for the following:    WBC 14.1 (*)    MCHC 36.2 (*)    All other components within normal limits  LIPASE, BLOOD  URINALYSIS, ROUTINE W REFLEX MICROSCOPIC (NOT AT [facility])   Imaging Review Ct Abdomen Pelvis W Contrast  01/21/2016  CLINICAL DATA:  Abdominal pain and distention with nausea and vomiting for several days. Cholecystectomy. EXAM: CT ABDOMEN AND PELVIS WITH CONTRAST TECHNIQUE: Multidetector CT imaging of the abdomen and pelvis was performed using the standard protocol following bolus administration of intravenous contrast. CONTRAST:  OMNIPAQUE IOHEXOL 300 MG/ML  SOLN COMPARISON:  None. FINDINGS: Lower chest: Anterior right lower lobe 4 mm solid pulmonary nodule (series 205/image 3). Hepatobiliary: Hypodense 0.4 cm lesion in segment 8 at the right liver dome,  too small to characterize. Otherwise normal liver. Cholecystectomy. No biliary ductal dilatation. Pancreas: Normal, with no mass or duct dilation. Spleen: Normal size. No mass. Adrenals/Urinary Tract: Normal adrenals. Normal kidneys with no hydronephrosis and no renal mass. Normal bladder. Stomach/Bowel: Grossly normal stomach. Normal caliber small bowel. Oral contrast progresses to the rectum. There is continuous circumferential bowel wall thickening in the  distal and terminal ileum. There is diffuse dilatation of the appendix (13 mm appendiceal diameter). There is diffuse wall thickening and wall hyperenhancement throughout the appendix, with associated periappendiceal fat stranding and ill-defined fluid in the right lower quadrant, in keeping with acute appendicitis. Normal large bowel with no diverticulosis, large bowel wall thickening or pericolonic fat stranding. Vascular/Lymphatic: Normal caliber abdominal aorta. Patent portal, splenic, hepatic and renal veins. No pathologically enlarged lymph nodes in the abdomen or pelvis. Reproductive: Top-normal size prostate. Other: No pneumoperitoneum, ascites or focal fluid collection. Musculoskeletal: No aggressive appearing focal osseous lesions. Mild degenerative changes in the visualized thoracolumbar spine. IMPRESSION: 1. Acute appendicitis.  No perforation or abscess. 2. Wall thickening in the distal and terminal ileum, probably reactive. 3. Right lower lobe 4 mm pulmonary nodule. If the patient is at high risk for bronchogenic carcinoma, follow-up chest CT at 1 year is recommended. If the patient is at low risk, no follow-up is needed. This recommendation follows the consensus statement: Guidelines for Management of Small Pulmonary Nodules Detected on CT Scans: A Statement from the Fleischner Society as published in Radiology 2005; 237:395-400. These results were called by telephone at the time of interpretation on 01/21/2016 at 5:50 pm to PA [facility] , who verbally acknowledged these results. Electronically Signed   By: Delbert Phenix M.D.   On: 01/21/2016 17:52   I have personally reviewed and evaluated these images and lab results as part of my medical decision-making.   EKG Interpretation None      MDM  I have reviewed relevant laboratory values. I have reviewed relevant imaging studies. I have reviewed the relevant previous healthcare records. I obtained HPI from historian. Patient discussed with  supervising physician  ED Course:  Assessment: 70y M [facility] with lower abdominal pain since Wednesday with associated N/V. Pt does not have a gallbladder. Has decrease in PO intake. Also dysuria. Labs show mild Leukocytosis. Afebrile. CT ABD showed acute appendicitis with no perforation. Will admit to General Surgery- Dr. Dwain Sarna.    Disposition/Plan:  Admit Pt acknowledges and agrees with plan   Supervising Physician Benjiman Core, MD   Final diagnoses:  Acute appendicitis with localized peritonitis      Audry Pili, PA-C 01/21/16 1839  Benjiman Core, MD 01/22/16 [phone]

## 2016-01-22 ENCOUNTER — Encounter (HOSPITAL_COMMUNITY): Payer: Self-pay | Admitting: General Surgery

## 2016-01-22 LAB — CBC
HCT: 43.9 % (ref 39.0–52.0)
Hemoglobin: 16.1 g/dL (ref 13.0–17.0)
MCH: 31 pg (ref 26.0–34.0)
MCHC: 36.7 g/dL — AB (ref 30.0–36.0)
MCV: 84.4 fL (ref 78.0–100.0)
PLATELETS: 199 10*3/uL (ref 150–400)
RBC: 5.2 MIL/uL (ref 4.22–5.81)
RDW: 12.2 % (ref 11.5–15.5)
WBC: 12.2 10*3/uL — AB (ref 4.0–10.5)

## 2016-01-22 MED ORDER — CIPROFLOXACIN HCL 500 MG PO TABS
500.0000 mg | ORAL_TABLET | Freq: Two times a day (BID) | ORAL | Status: DC
  Administered 2016-01-22 – 2016-01-23 (×2): 500 mg via ORAL
  Filled 2016-01-22 (×2): qty 1

## 2016-01-22 MED ORDER — PANTOPRAZOLE SODIUM 40 MG PO TBEC
40.0000 mg | DELAYED_RELEASE_TABLET | Freq: Every day | ORAL | Status: DC
  Administered 2016-01-22: 40 mg via ORAL
  Filled 2016-01-22: qty 1

## 2016-01-22 MED ORDER — AMLODIPINE BESYLATE 5 MG PO TABS
5.0000 mg | ORAL_TABLET | Freq: Every day | ORAL | Status: DC
  Administered 2016-01-22 – 2016-01-23 (×2): 5 mg via ORAL
  Filled 2016-01-22 (×2): qty 1

## 2016-01-22 MED ORDER — METRONIDAZOLE 500 MG PO TABS
500.0000 mg | ORAL_TABLET | Freq: Three times a day (TID) | ORAL | Status: DC
  Administered 2016-01-22 – 2016-01-23 (×2): 500 mg via ORAL
  Filled 2016-01-22 (×2): qty 1

## 2016-01-22 MED ORDER — INFLUENZA VAC SPLIT QUAD 0.5 ML IM SUSY
0.5000 mL | PREFILLED_SYRINGE | INTRAMUSCULAR | Status: AC
  Administered 2016-01-23: 0.5 mL via INTRAMUSCULAR
  Filled 2016-01-22: qty 0.5

## 2016-01-22 NOTE — Progress Notes (Signed)
1 Day Post-Op  Subjective: He seems OK this AM, sore and has not eaten so far.  Sites all look fine  Objective: Vital signs in last 24 hours: Temp:  [97.5 F (36.4 C)-98.8 F (37.1 C)] 98.8 F (37.1 C) (01/31 0518) Pulse Rate:  [67-77] 67 (01/31 0518) Resp:  [16-17] 16 (01/31 0518) BP: (109-140)/(55-92) 125/90 mmHg (01/31 0518) SpO2:  [98 %-100 %] 98 % (01/31 0518) Weight:  [69.854 kg (154 lb)-89.3 kg (196 lb 13.9 oz)] 89.3 kg (196 lb 13.9 oz) (01/30 2212) Last BM Date: 01/21/16 720 PO  Regular diet Afebrile, VSS WBC down to 12.2 Intake/Output from previous day: 01/30 0701 - 01/31 0700 In: 1420 [P.O.:720; I.V.:700] Out: 2555 [Urine:2550; Blood:5] Intake/Output this shift:    General appearance: alert, cooperative and no distress Resp: clear to auscultation bilaterally GI: sore, sites ok, BS hypoactive  Lab Results:   Recent Labs  01/21/16 1546 01/22/16 0503  WBC 14.1* 12.2*  HGB 15.4 16.1  HCT 42.5 43.9  PLT 210 199    BMET  Recent Labs  01/21/16 1546  NA 139  K 4.4  CL 102  CO2 23  GLUCOSE 101*  BUN 13  CREATININE 1.09  CALCIUM 9.2   PT/INR No results for input(s): LABPROT, INR in the last 72 hours.   Recent Labs Lab 01/21/16 1546  AST 18  ALT 20  ALKPHOS 49  BILITOT 0.9  PROT 7.1  ALBUMIN 3.7     Lipase     Component Value Date/Time   LIPASE 19 01/21/2016 1546     Studies/Results: Ct Abdomen Pelvis W Contrast  01/21/2016  CLINICAL DATA:  Abdominal pain and distention with nausea and vomiting for several days. Cholecystectomy. EXAM: CT ABDOMEN AND PELVIS WITH CONTRAST TECHNIQUE: Multidetector CT imaging of the abdomen and pelvis was performed using the standard protocol following bolus administration of intravenous contrast. CONTRAST:  OMNIPAQUE IOHEXOL 300 MG/ML  SOLN COMPARISON:  None. FINDINGS: Lower chest: Anterior right lower lobe 4 mm solid pulmonary nodule (series 205/image 3). Hepatobiliary: Hypodense 0.4 cm lesion in  segment 8 at the right liver dome, too small to characterize. Otherwise normal liver. Cholecystectomy. No biliary ductal dilatation. Pancreas: Normal, with no mass or duct dilation. Spleen: Normal size. No mass. Adrenals/Urinary Tract: Normal adrenals. Normal kidneys with no hydronephrosis and no renal mass. Normal bladder. Stomach/Bowel: Grossly normal stomach. Normal caliber small bowel. Oral contrast progresses to the rectum. There is continuous circumferential bowel wall thickening in the distal and terminal ileum. There is diffuse dilatation of the appendix (13 mm appendiceal diameter). There is diffuse wall thickening and wall hyperenhancement throughout the appendix, with associated periappendiceal fat stranding and ill-defined fluid in the right lower quadrant, in keeping with acute appendicitis. Normal large bowel with no diverticulosis, large bowel wall thickening or pericolonic fat stranding. Vascular/Lymphatic: Normal caliber abdominal aorta. Patent portal, splenic, hepatic and renal veins. No pathologically enlarged lymph nodes in the abdomen or pelvis. Reproductive: Top-normal size prostate. Other: No pneumoperitoneum, ascites or focal fluid collection. Musculoskeletal: No aggressive appearing focal osseous lesions. Mild degenerative changes in the visualized thoracolumbar spine. IMPRESSION: 1. Acute appendicitis.  No perforation or abscess. 2. Wall thickening in the distal and terminal ileum, probably reactive. 3. Right lower lobe 4 mm pulmonary nodule. If the patient is at high risk for bronchogenic carcinoma, follow-up chest CT at 1 year is recommended. If the patient is at low risk, no follow-up is needed. This recommendation follows the consensus statement: Guidelines for Management  of Small Pulmonary Nodules Detected on CT Scans: A Statement from the Fleischner Society as published in Radiology 2005; 237:395-400. These results were called by telephone at the time of interpretation on 01/21/2016 at  5:50 pm to PA [facility] , who verbally acknowledged these results. Electronically Signed   By: Delbert Phenix M.D.   On: 01/21/2016 17:52    Medications: . ciprofloxacin  400 mg Intravenous Q12H   And  . metronidazole  500 mg Intravenous Q8H  . enoxaparin (LOVENOX) injection  40 mg Subcutaneous Q24H  . [START ON 01/23/2016] Influenza vac split quadrivalent PF  0.5 mL Intramuscular Tomorrow-1000  . pantoprazole (PROTONIX) IV  40 mg Intravenous QHS   . sodium chloride 50 mL/hr at 01/21/16 2227   Prior to Admission medications   Not on File    Assessment/Plan acute suppurative appendicitis S/p laparoscopic appendectomy 01/21/16, Dr. Dwain Sarna Hx of hypertension Hx of tobacco use Antibiotics: day 2 Cipro/Flagyl DVT:  Lovenox/SCD   Plan:  Continue IV fluids, his H/H is actually up this AM.  Advance diet, mobilize.  If he does well home tomorrow.  He says he is on Norvasc and lisinopril at home, but quit when he got sick.  No records. I will start him on Norvasc 5 mg and see how he does recheck labs in AM.  Hopefully home on PO abx tomorrow, Dr. Dwain Sarna recommends 5 days of abx at this point.  LOS: 1 day    Glenn Hanson 01/22/2016

## 2016-01-22 NOTE — Care Management Note (Signed)
Case Management Note  Patient Details  Name: Glenn Hanson MRN: [id number] Date of Birth: 10/29/1971  Subjective/Objective:                    Action/Plan:  Initial UR completed   Expected Discharge Date:                  Expected Discharge Plan:  Home/Self Care  In-House Referral:     Discharge planning Services     Post Acute Care Choice:    Choice offered to:     DME Arranged:    DME Agency:     HH Arranged:    HH Agency:     Status of Service:  In process, will continue to follow  Medicare Important Message Given:    Date Medicare IM Given:    Medicare IM give by:    Date Additional Medicare IM Given:    Additional Medicare Important Message give by:     If discussed at Long Length of Stay Meetings, dates discussed:    Additional Comments:  Kingsley Plan, RN 01/22/2016, 3:14 PM

## 2016-01-23 LAB — BASIC METABOLIC PANEL
ANION GAP: 6 (ref 5–15)
BUN: 11 mg/dL (ref 6–20)
CALCIUM: 8.8 mg/dL — AB (ref 8.9–10.3)
CHLORIDE: 106 mmol/L (ref 101–111)
CO2: 29 mmol/L (ref 22–32)
Creatinine, Ser: 1.09 mg/dL (ref 0.61–1.24)
Glucose, Bld: 91 mg/dL (ref 65–99)
POTASSIUM: 3.5 mmol/L (ref 3.5–5.1)
SODIUM: 141 mmol/L (ref 135–145)

## 2016-01-23 LAB — CBC
HCT: 39.9 % (ref 39.0–52.0)
HEMOGLOBIN: 14.2 g/dL (ref 13.0–17.0)
MCH: 30.2 pg (ref 26.0–34.0)
MCHC: 35.6 g/dL (ref 30.0–36.0)
MCV: 84.9 fL (ref 78.0–100.0)
PLATELETS: 219 10*3/uL (ref 150–400)
RBC: 4.7 MIL/uL (ref 4.22–5.81)
RDW: 12 % (ref 11.5–15.5)
WBC: 10.9 10*3/uL — AB (ref 4.0–10.5)

## 2016-01-23 MED ORDER — ACETAMINOPHEN-CODEINE #3 300-30 MG PO TABS: 1.0000 | ORAL_TABLET | ORAL | Status: AC | PRN

## 2016-01-23 MED ORDER — CIPROFLOXACIN HCL 500 MG PO TABS: 500.0000 mg | ORAL_TABLET | Freq: Two times a day (BID) | ORAL | Status: AC

## 2016-01-23 MED ORDER — METRONIDAZOLE 500 MG PO TABS: 500.0000 mg | ORAL_TABLET | Freq: Three times a day (TID) | ORAL | Status: AC

## 2016-01-23 MED ORDER — AMLODIPINE BESYLATE 5 MG PO TABS: 5.0000 mg | ORAL_TABLET | Freq: Every day | ORAL | Status: AC

## 2016-01-23 NOTE — Care Management (Signed)
Patient from [facility] with Herbert Seta [phone])  from [employer] instructions to fax H and P , Op report and DC summary to her at [phone] . Bedside nurse to call report to [facility] Nurses Station at [phone] and send prescriptions and discharge instructions with guard. Tara aware .    Information faxed.  Ronny Flurry RN BSN [phone]

## 2016-01-23 NOTE — Progress Notes (Signed)
Discussed discharge summary with patient. Reviewed all medications with patient. Patient received Rx. Patient ready for discharge. Attempted to call report to [facility] nurse station but kept receiving voicemail. Message left on answering machine. Stated also to return call. Patient in route back to facility via guards.

## 2016-01-23 NOTE — Discharge Instructions (Signed)

## 2016-01-23 NOTE — Discharge Summary (Signed)
Physician Discharge Summary  Patient ID: Glenn Hanson MRN: [id number] DOB/AGE: [birth year] 45 y.o.  Admit date: 01/21/2016 Discharge date: 01/23/2016  Admitting Diagnosis: Acute appendicitis  Discharge Diagnosis Patient Active Problem List   Diagnosis Date Noted  . Appendicitis 01/21/2016  acute suppurative appendicitis   Consultants none  Imaging: Ct Abdomen Pelvis W Contrast  01/21/2016  CLINICAL DATA:  Abdominal pain and distention with nausea and vomiting for several days. Cholecystectomy. EXAM: CT ABDOMEN AND PELVIS WITH CONTRAST TECHNIQUE: Multidetector CT imaging of the abdomen and pelvis was performed using the standard protocol following bolus administration of intravenous contrast. CONTRAST:  OMNIPAQUE IOHEXOL 300 MG/ML  SOLN COMPARISON:  None. FINDINGS: Lower chest: Anterior right lower lobe 4 mm solid pulmonary nodule (series 205/image 3). Hepatobiliary: Hypodense 0.4 cm lesion in segment 8 at the right liver dome, too small to characterize. Otherwise normal liver. Cholecystectomy. No biliary ductal dilatation. Pancreas: Normal, with no mass or duct dilation. Spleen: Normal size. No mass. Adrenals/Urinary Tract: Normal adrenals. Normal kidneys with no hydronephrosis and no renal mass. Normal bladder. Stomach/Bowel: Grossly normal stomach. Normal caliber small bowel. Oral contrast progresses to the rectum. There is continuous circumferential bowel wall thickening in the distal and terminal ileum. There is diffuse dilatation of the appendix (13 mm appendiceal diameter). There is diffuse wall thickening and wall hyperenhancement throughout the appendix, with associated periappendiceal fat stranding and ill-defined fluid in the right lower quadrant, in keeping with acute appendicitis. Normal large bowel with no diverticulosis, large bowel wall thickening or pericolonic fat stranding. Vascular/Lymphatic: Normal caliber abdominal aorta. Patent portal, splenic, hepatic and renal  veins. No pathologically enlarged lymph nodes in the abdomen or pelvis. Reproductive: Top-normal size prostate. Other: No pneumoperitoneum, ascites or focal fluid collection. Musculoskeletal: No aggressive appearing focal osseous lesions. Mild degenerative changes in the visualized thoracolumbar spine. IMPRESSION: 1. Acute appendicitis.  No perforation or abscess. 2. Wall thickening in the distal and terminal ileum, probably reactive. 3. Right lower lobe 4 mm pulmonary nodule. If the patient is at high risk for bronchogenic carcinoma, follow-up chest CT at 1 year is recommended. If the patient is at low risk, no follow-up is needed. This recommendation follows the consensus statement: Guidelines for Management of Small Pulmonary Nodules Detected on CT Scans: A Statement from the Fleischner Society as published in Radiology 2005; 237:395-400. These results were called by telephone at the time of interpretation on 01/21/2016 at 5:50 pm to PA [facility] , who verbally acknowledged these results. Electronically Signed   By: Delbert Phenix M.D.   On: 01/21/2016 17:52    Procedures Laparoscopic appendectomy---Dr. Marta Lamas Course:  Glenn Hanson is a 45 old male with a history of hypertension who presented to [facility] with abdominal pain.  Workup showed appendicitis.  Patient was admitted and underwent procedure listed above.  Tolerated procedure well and was transferred to the floor.  Diet was advanced as tolerated.  On POD#1, the patient was voiding well, tolerating diet, ambulating well, pain well controlled, vital signs stable, incisions c/d/i and felt [facility] for [facility].  Medication risks, benefits and therapeutic alternatives were reviewed with the patient.  He verbalizes understanding.   Physical Exam: General:  Alert, NAD, pleasant, comfortable Abd:  Soft, ND, mild tenderness, incisions C/D/I    Medication List    TAKE these medications        acetaminophen-codeine 300-30 MG tablet   Commonly known as:  TYLENOL #3  Take 1 tablet by mouth every 4 (four) hours  as needed for moderate pain.     amLODipine 5 MG tablet  Commonly known as:  NORVASC  Take 1 tablet (5 mg total) by mouth daily.     ciprofloxacin 500 MG tablet  Commonly known as:  CIPRO  Take 1 tablet (500 mg total) by mouth 2 (two) times daily.     metroNIDAZOLE 500 MG tablet  Commonly known as:  FLAGYL  Take 1 tablet (500 mg total) by mouth every 8 (eight) hours.             Follow-up Information    Follow up with [facility].   Specialty:  General Surgery   Why:  As needed   Contact information:   [address] [place] [address] [phone]       Signed: Ashok Norris, [facility] [phone]  01/23/2016, 7:24 AM

## 2017-06-13 IMAGING — CT CT ABD-PELV W/ CM
2 of 5 series · 10 of 46 positions shown, 11 images · IV contrast (Iodine)
Comparison: None.

CLINICAL DATA: Abdominal pain and distention with nausea and
vomiting for several days. Cholecystectomy.

EXAM:
CT ABDOMEN AND PELVIS WITH CONTRAST
TECHNIQUE: Multidetector CT imaging of the abdomen and pelvis was performed
using the standard protocol following bolus administration of
intravenous contrast.
CONTRAST:  100mL OMNIPAQUE IOHEXOL 300 MG/ML  SOLN

[Series 201: routine, idose (2) · axial · 0.78mm/px · z∈[+413,+793]mm · 7 of 96 slices shown, 8 images]
[im 10/96  soft-tissue]
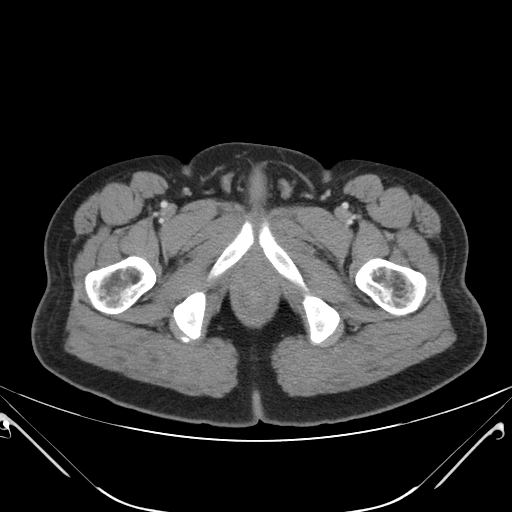
[im 10/96  bone]
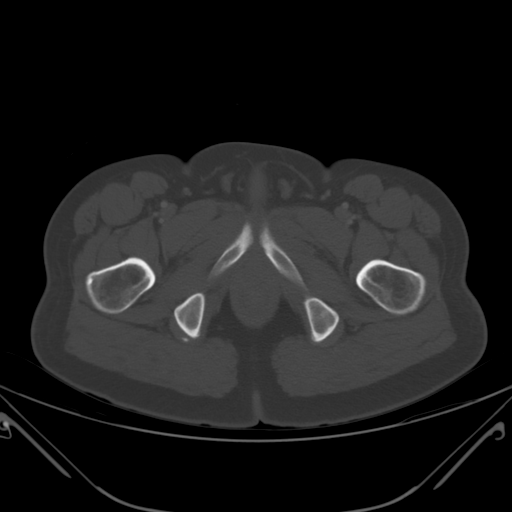
[im 24/96  soft-tissue]
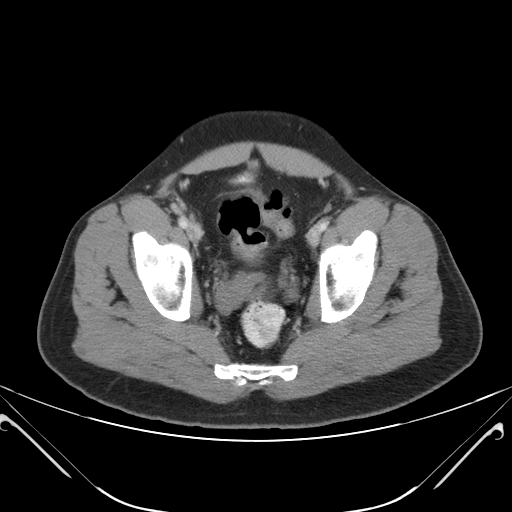
[im 34/96  soft-tissue]
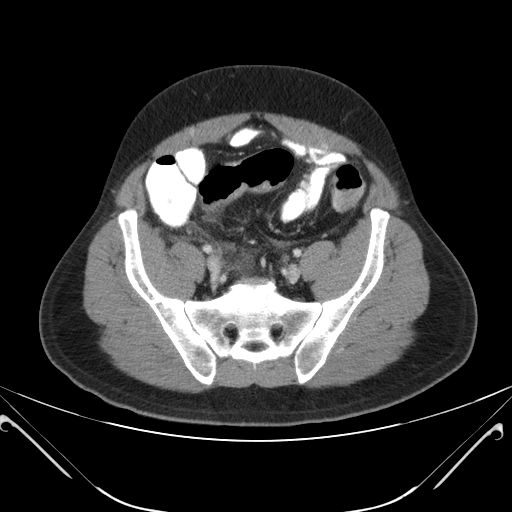
[im 48/96  soft-tissue]
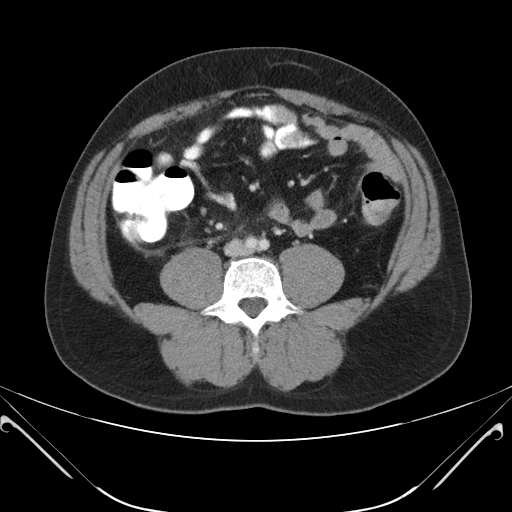
[im 62/96  soft-tissue]
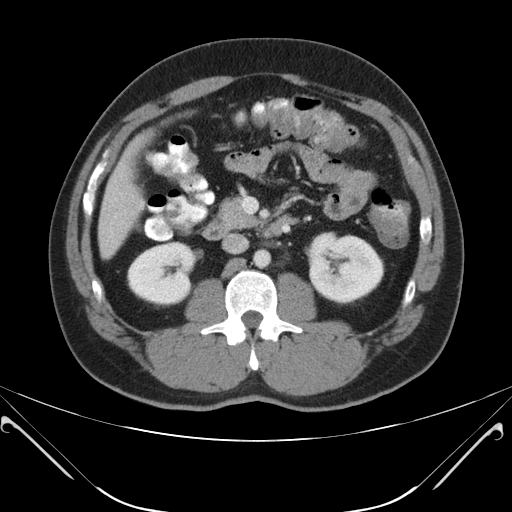
[im 72/96  soft-tissue]
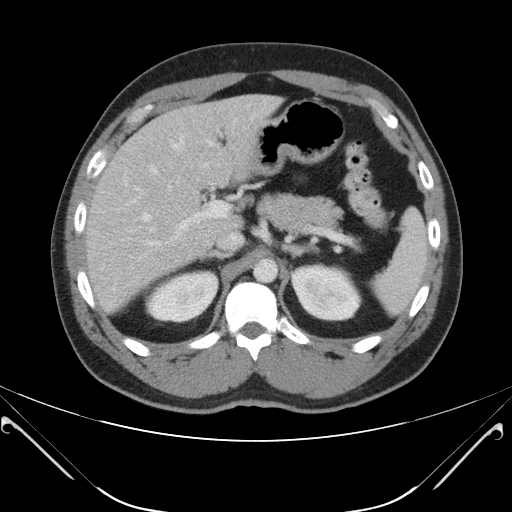
[im 86/96  soft-tissue]
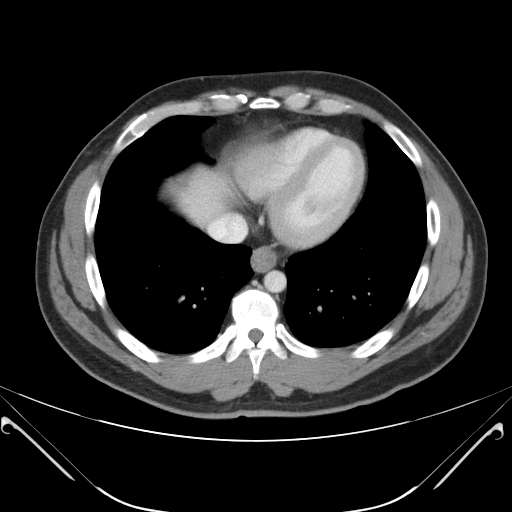

[Series 203: coronals, idose (2) · coronal · 0.45mm/px · 3 of 136 slices shown]
[im 46/136  soft-tissue]
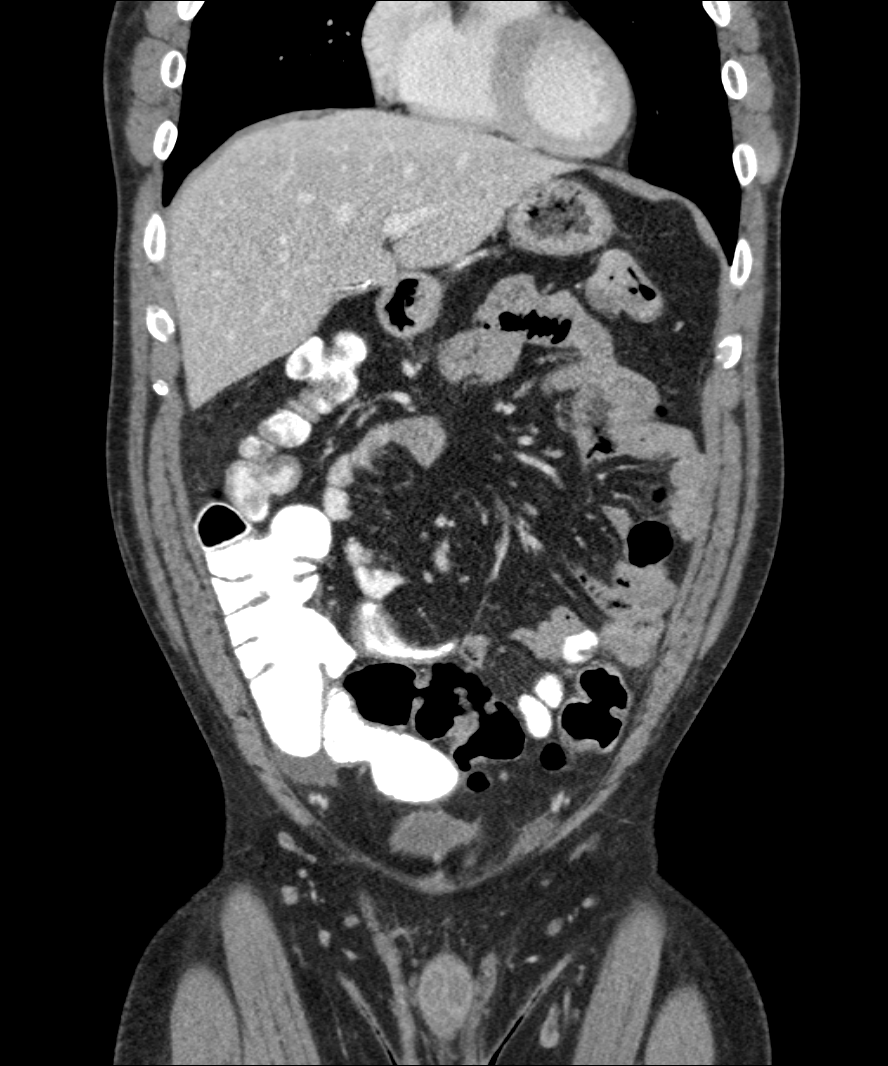
[im 61/136  soft-tissue]
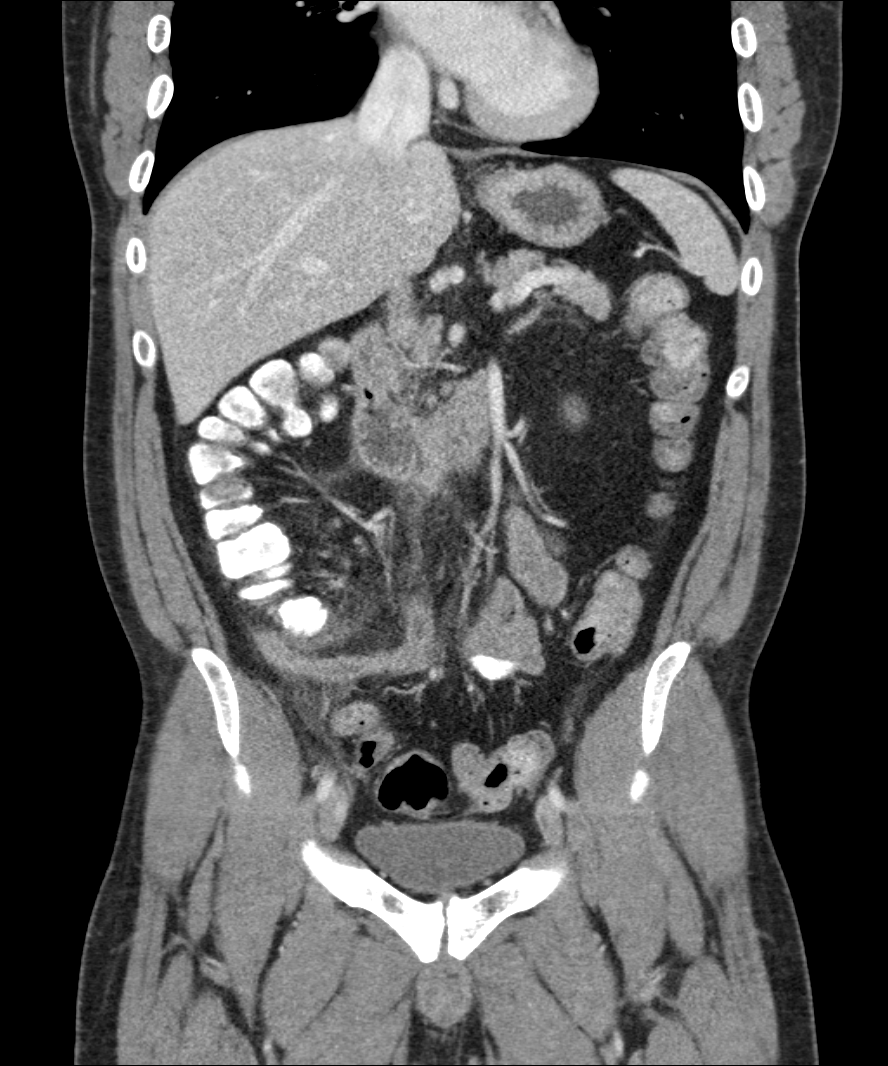
[im 76/136  soft-tissue]
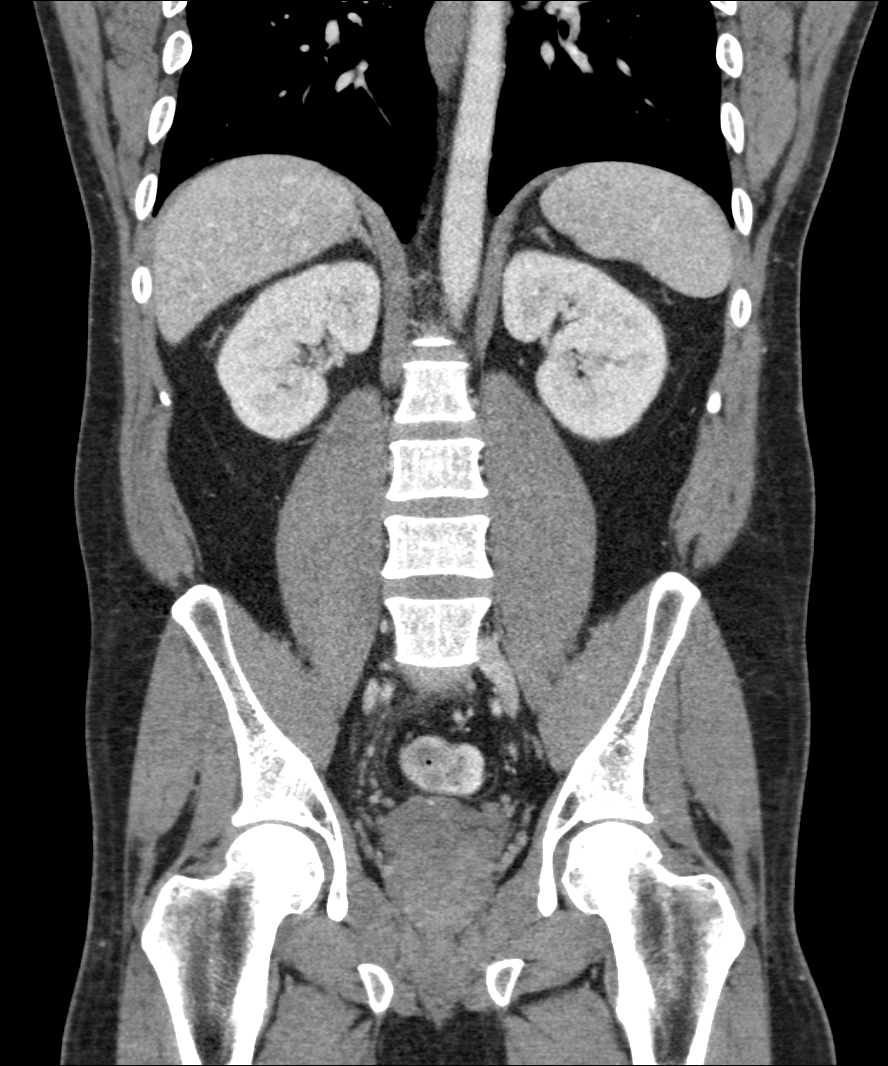

[10 of 46 positions shown; findings below may reference images not displayed]

FINDINGS: Lower chest: Anterior right lower lobe 4 mm solid pulmonary nodule
(series 205/image 3).

Hepatobiliary: Hypodense 0.4 cm lesion in segment 8 at the right
liver dome, too small to characterize. Otherwise normal liver.
Cholecystectomy. No biliary ductal dilatation.

Pancreas: Normal, with no mass or duct dilation.

Spleen: Normal size. No mass.

Adrenals/Urinary Tract: Normal adrenals. Normal kidneys with no
hydronephrosis and no renal mass. Normal bladder.

Stomach/Bowel: Grossly normal stomach. Normal caliber small bowel.
Oral contrast progresses to the rectum. There is continuous
circumferential bowel wall thickening in the distal and terminal
ileum. There is diffuse dilatation of the appendix (13 mm
appendiceal diameter). There is diffuse wall thickening and wall
hyperenhancement throughout the appendix, with associated
periappendiceal fat stranding and ill-defined fluid in the right
lower quadrant, in keeping with acute appendicitis. Normal large
bowel with no diverticulosis, large bowel wall thickening or
pericolonic fat stranding.

Vascular/Lymphatic: Normal caliber abdominal aorta. Patent portal,
splenic, hepatic and renal veins. No pathologically enlarged lymph
nodes in the abdomen or pelvis.

Reproductive: Top-normal size prostate.

Other: No pneumoperitoneum, ascites or focal fluid collection.

Musculoskeletal: No aggressive appearing focal osseous lesions. Mild
degenerative changes in the visualized thoracolumbar spine.
IMPRESSION: 1. Acute appendicitis.  No perforation or abscess.
2. Wall thickening in the distal and terminal ileum, probably
reactive.
3. Right lower lobe 4 mm pulmonary nodule. If the patient is at high
risk for bronchogenic carcinoma, follow-up chest CT at 1 year is
recommended. If the patient is at low risk, no follow-up is needed.
This recommendation follows the consensus statement: Guidelines for
Management of Small Pulmonary Nodules Detected on CT Scans: A
Statement from the [HOSPITAL] as published in Radiology
7336; [DATE].
These results were called by telephone at the time of interpretation
on 01/21/2016 at [DATE] to PA [employer] [employer] , who verbally acknowledged
these results.
